# Patient Record
Sex: Female | Born: 2000 | Race: Black or African American | Hispanic: No | Marital: Single | Smoking: Never smoker
Health system: Southern US, Community
[De-identification: ages and names within clinical notes are randomized; demographics above are authoritative.]

## PROBLEM LIST (undated history)

## (undated) DIAGNOSIS — E301 Precocious puberty: Secondary | ICD-10-CM

## (undated) HISTORY — PX: TONSILLECTOMY: SUR1361

## (undated) HISTORY — DX: Precocious puberty: E30.1

---
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
## 2020-01-23 ENCOUNTER — Other Ambulatory Visit: Payer: Self-pay | Admitting: Women's Health

## 2020-01-28 ENCOUNTER — Telehealth: Payer: Self-pay | Admitting: *Deleted

## 2020-01-28 NOTE — Telephone Encounter (Signed)
Patient mother called requesting refill on Yaz birth control pills, over due for annual exam, will route to appointment desk to call and schedule with patient.

## 2020-01-28 NOTE — Telephone Encounter (Signed)
Left message for patient to call and schedule appointment for wellness exam.

## 2020-04-13 ENCOUNTER — Other Ambulatory Visit: Payer: Self-pay

## 2020-04-13 ENCOUNTER — Ambulatory Visit: Payer: Federal, State, Local not specified - PPO | Attending: Internal Medicine

## 2020-04-13 DIAGNOSIS — Z23 Encounter for immunization: Secondary | ICD-10-CM

## 2020-04-13 NOTE — Progress Notes (Signed)
   Covid-19 Vaccination Clinic  Name:  MANVIR THORSON    MRN: [id number] DOB: 09-22-2001  04/13/2020  Ms. Sinor was observed post Covid-19 immunization for 15 minutes without incident. She was provided with Vaccine Information Sheet and instruction to access the V-Safe system.   Ms. Jessie was instructed to call 911 with any severe reactions post vaccine: Marland Kitchen Difficulty breathing  . Swelling of face and throat  . A fast heartbeat  . A bad rash all over body  . Dizziness and weakness   Immunizations Administered    Name Date Dose VIS Date Route   Pfizer COVID-19 Vaccine 04/13/2020 11:14 AM 0.3 mL 02/19/2019 Intramuscular   Manufacturer: [employer], [employer]   Lot: [id number]   NDC: [address]-4

## 2020-05-05 ENCOUNTER — Ambulatory Visit: Payer: Federal, State, Local not specified - PPO | Attending: Internal Medicine

## 2020-05-05 DIAGNOSIS — Z23 Encounter for immunization: Secondary | ICD-10-CM

## 2020-05-05 NOTE — Progress Notes (Signed)
   Covid-19 Vaccination Clinic  Name:  Cassandra Davies    MRN: [id number] DOB: 03-23-2001  05/05/2020  [name] was observed post Covid-19 immunization for 15 minutes without incident. She was provided with Vaccine Information Sheet and instruction to access the V-Safe system.   [name] was instructed to call 911 with any severe reactions post vaccine: Marland Kitchen Difficulty breathing  . Swelling of face and throat  . A fast heartbeat  . A bad rash all over body  . Dizziness and weakness   Immunizations Administered    Name Date Dose VIS Date Route   Pfizer COVID-19 Vaccine 05/05/2020 10:58 AM 0.3 mL 02/19/2019 Intramuscular   Manufacturer: [employer], [employer]   Lot: [id number]   NDC: [address]-8

## 2020-07-30 DIAGNOSIS — M79674 Pain in right toe(s): Secondary | ICD-10-CM | POA: Diagnosis not present

## 2020-07-30 DIAGNOSIS — M79671 Pain in right foot: Secondary | ICD-10-CM | POA: Diagnosis not present

## 2020-10-08 ENCOUNTER — Encounter: Payer: Self-pay | Admitting: Nurse Practitioner

## 2020-10-08 ENCOUNTER — Other Ambulatory Visit: Payer: Self-pay

## 2020-10-08 ENCOUNTER — Ambulatory Visit: Payer: Federal, State, Local not specified - PPO | Admitting: Nurse Practitioner

## 2020-10-08 VITALS — BP 124/80 | Ht 60.0 in | Wt 135.0 lb

## 2020-10-08 DIAGNOSIS — Z30016 Encounter for initial prescription of transdermal patch hormonal contraceptive device: Secondary | ICD-10-CM | POA: Diagnosis not present

## 2020-10-08 DIAGNOSIS — Z01419 Encounter for gynecological examination (general) (routine) without abnormal findings: Secondary | ICD-10-CM

## 2020-10-08 MED ORDER — NORELGESTROMIN-ETH ESTRADIOL 150-35 MCG/24HR TD PTWK: 1.0000 | MEDICATED_PATCH | TRANSDERMAL | 3 refills | Status: DC

## 2020-10-08 NOTE — Patient Instructions (Addendum)
Health Maintenance, Female Adopting a healthy lifestyle and getting preventive care are important in promoting health and wellness. Ask your health care provider about:  The right schedule for you to have regular tests and exams.  Things you can do on your own to prevent diseases and keep yourself healthy. What should I know about diet, weight, and exercise? Eat a healthy diet   Eat a diet that includes plenty of vegetables, fruits, low-fat dairy products, and lean protein.  Do not eat a lot of foods that are high in solid fats, added sugars, or sodium. Maintain a healthy weight Body mass index (BMI) is used to identify weight problems. It estimates body fat based on height and weight. Your health care provider can help determine your BMI and help you achieve or maintain a healthy weight. Get regular exercise Get regular exercise. This is one of the most important things you can do for your health. Most adults should:  Exercise for at least 150 minutes each week. The exercise should increase your heart rate and make you sweat (moderate-intensity exercise).  Do strengthening exercises at least twice a week. This is in addition to the moderate-intensity exercise.  Spend less time sitting. Even light physical activity can be beneficial. Watch cholesterol and blood lipids Have your blood tested for lipids and cholesterol at 20 years of age, then have this test every 5 years. Have your cholesterol levels checked more often if:  Your lipid or cholesterol levels are high.  You are older than 19 years of age.  You are at high risk for heart disease. What should I know about cancer screening? Depending on your health history and family history, you may need to have cancer screening at various ages. This may include screening for:  Breast cancer.  Cervical cancer.  Colorectal cancer.  Skin cancer.  Lung cancer. What should I know about heart disease, diabetes, and high blood  pressure? Blood pressure and heart disease  High blood pressure causes heart disease and increases the risk of stroke. This is more likely to develop in people who have high blood pressure readings, are of African descent, or are overweight.  Have your blood pressure checked: ? Every 3-5 years if you are 18-39 years of age. ? Every year if you are 40 years old or older. Diabetes Have regular diabetes screenings. This checks your fasting blood sugar level. Have the screening done:  Once every three years after age 40 if you are at a normal weight and have a low risk for diabetes.  More often and at a younger age if you are overweight or have a high risk for diabetes. What should I know about preventing infection? Hepatitis B If you have a higher risk for hepatitis B, you should be screened for this virus. Talk with your health care provider to find out if you are at risk for hepatitis B infection. Hepatitis C Testing is recommended for:  Everyone born from 1945 through 1965.  Anyone with known risk factors for hepatitis C. Sexually transmitted infections (STIs)  Get screened for STIs, including gonorrhea and chlamydia, if: ? You are sexually active and are younger than 19 years of age. ? You are older than 19 years of age and your health care provider tells you that you are at risk for this type of infection. ? Your sexual activity has changed since you were last screened, and you are at increased risk for chlamydia or gonorrhea. Ask your health care provider if   you are at risk.  Ask your health care provider about whether you are at high risk for HIV. Your health care provider may recommend a prescription medicine to help prevent HIV infection. If you choose to take medicine to prevent HIV, you should first get tested for HIV. You should then be tested every 3 months for as long as you are taking the medicine. Pregnancy  If you are about to stop having your period (premenopausal) and  you may become pregnant, seek counseling before you get pregnant.  Take 400 to 800 micrograms (mcg) of folic acid every day if you become pregnant.  Ask for birth control (contraception) if you want to prevent pregnancy. Osteoporosis and menopause Osteoporosis is a disease in which the bones lose minerals and strength with aging. This can result in bone fractures. If you are 62 years old or older, or if you are at risk for osteoporosis and fractures, ask your health care provider if you should:  Be screened for bone loss.  Take a calcium or vitamin D supplement to lower your risk of fractures.  Be given hormone replacement therapy (HRT) to treat symptoms of menopause. Follow these instructions at home: Lifestyle  Do not use any products that contain nicotine or tobacco, such as cigarettes, e-cigarettes, and chewing tobacco. If you need help quitting, ask your health care provider.  Do not use street drugs.  Do not share needles.  Ask your health care provider for help if you need support or information about quitting drugs. Alcohol use  Do not drink alcohol if: ? Your health care provider tells you not to drink. ? You are pregnant, may be pregnant, or are planning to become pregnant.  If you drink alcohol: ? Limit how much you use to 0-1 drink a day. ? Limit intake if you are breastfeeding.  Be aware of how much alcohol is in your drink. In the [place] one drink equals one 12 oz bottle of beer (355 mL), one 5 oz glass of wine (148 mL), or one 1 oz glass of hard liquor (44 mL). General instructions  Schedule regular health, dental, and eye exams.  Stay current with your vaccines.  Tell your health care provider if: ? You often feel depressed. ? You have ever been abused or do not feel safe at home. Summary  Adopting a healthy lifestyle and getting preventive care are important in promoting health and wellness.  Follow your health care provider's instructions about healthy  diet, exercising, and getting tested or screened for diseases.  Follow your health care provider's instructions on monitoring your cholesterol and blood pressure. This information is not intended to replace advice given to you by your health care provider. Make sure you discuss any questions you have with your health care provider. Document Revised: 12/05/2018 Document Reviewed: 12/05/2018 Elsevier Patient Education  2020 Elsevier Inc. Ethinyl Estradiol; Norelgestromin skin patches What is this medicine? ETHINYL ESTRADIOL;NORELGESTROMIN (ETH in il es tra DYE ole; nor el JES troe min) skin patch is used as a contraceptive (birth control method). This medicine combines two types of female hormones, an estrogen and a progestin. This patch is used to prevent ovulation and pregnancy. This medicine may be used for other purposes; ask your health care provider or pharmacist if you have questions. COMMON BRAND NAME(S): Ortho Christianne Borrow What should I tell my health care provider before I take this medicine? They need to know if you have or ever had any of these conditions:  abnormal  vaginal bleeding  blood vessel disease or blood clots  breast, cervical, endometrial, ovarian, liver, or uterine cancer  diabetes  gallbladder disease  having surgery  heart disease or recent heart attack  high blood pressure  high cholesterol or triglycerides  history of irregular heartbeat or heart valve problems  kidney disease  liver disease  migraine headaches  protein C deficiency  protein S deficiency  recently had a baby, miscarriage, or abortion  stroke  systemic lupus erythematosus (SLE)  tobacco smoker  an unusual or allergic reaction to estrogens, progestins, other medicines, foods, dyes, or preservatives  pregnant or trying to get pregnant  breast-feeding How should I use this medicine? This patch is applied to the skin. Follow the directions on the prescription label.  Apply to clean, dry, healthy skin on the buttock, abdomen, upper outer arm or upper torso, in a place where it will not be rubbed by tight clothing. Do not use lotions or other cosmetics on the site where the patch will go. Press the patch firmly in place for 10 seconds to ensure good contact with the skin. Change the patch every 7 days on the same day of the week for 3 weeks. You will then have a break from the patch for 1 week, after which you will apply a new patch. Do not use your medicine more often than directed. Contact your pediatrician regarding the use of this medicine in children. Special care may be needed. This medicine has been used in female children who have started having menstrual periods. A patient package insert for the product will be given with each prescription and refill. Read this sheet carefully each time. The sheet may change frequently. Overdosage: If you think you have taken too much of this medicine contact a poison control center or emergency room at once. NOTE: This medicine is only for you. Do not share this medicine with others. What if I miss a dose? You will need to replace your patch once a week as directed. If your patch is lost or falls off, contact your health care professional for advice. You may need to use another form of birth control if your patch has been off for more than 1 day. What may interact with this medicine? Do not take this medicine with the following medications:  dasabuvir; ombitasvir; paritaprevir; ritonavir  ombitasvir; paritaprevir; ritonavir This medicine may also interact with the following medications:  acetaminophen  antibiotics or medicines for infections, especially rifampin, rifabutin, rifapentine, and possibly penicillins or tetracyclines  aprepitant or fosaprepitant  armodafinil  ascorbic acid (vitamin C)  barbiturate medicines, such as phenobarbital or primidone  bosentan  certain antiviral medicines for hepatitis,  HIV or AIDS  certain medicines for cancer treatment  certain medicines for seizures like carbamazepine, clobazam, felbamate, lamotrigine, oxcarbazepine, phenytoin, rufinamide, topiramate  certain medicines for treating high cholesterol  cyclosporine  dantrolene  elagolix  flibanserin  grapefruit juice  lesinurad  medicines for diabetes  medicines to treat fungal infections, such as griseofulvin, miconazole, fluconazole, ketoconazole, itraconazole, posaconazole or voriconazole  mifepristone  mitotane  modafinil  morphine  mycophenolate  St. John's wort  tamoxifen  temazepam  theophylline or aminophylline  thyroid hormones  tizanidine  tranexamic acid  ulipristal  warfarin This list may not describe all possible interactions. Give your health care provider a list of all the medicines, herbs, non-prescription drugs, or dietary supplements you use. Also tell them if you smoke, drink alcohol, or use illegal drugs. Some items may interact with  your medicine. What should I watch for while using this medicine? Visit your doctor or health care professional for regular checks on your progress. You will need a regular breast and pelvic exam and Pap smear while on this medicine. Use an additional method of contraception during the first cycle that you use this patch. If you have any reason to think you are pregnant, stop using this medicine right away and contact your doctor or health care professional. If you are using this medicine for hormone related problems, it may take several cycles of use to see improvement in your condition. Smoking increases the risk of getting a blood clot or having a stroke while you are using hormonal birth control, especially if you are more than 19 years old. You are strongly advised not to smoke. This medicine can make your body retain fluid, making your fingers, hands, or ankles swell. Your blood pressure can go up. Contact your doctor  or health care professional if you feel you are retaining fluid. This medicine can make you more sensitive to the sun. Keep out of the sun. If you cannot avoid being in the sun, wear protective clothing and use sunscreen. Do not use sun lamps or tanning beds/booths. If you wear contact lenses and notice visual changes, or if the lenses begin to feel uncomfortable, consult your eye care specialist. In some women, tenderness, swelling, or minor bleeding of the gums may occur. Notify your dentist if this happens. Brushing and flossing your teeth regularly may help limit this. See your dentist regularly and inform your dentist of the medicines you are taking. If you are going to have elective surgery or a MRI, you may need to stop using this medicine before the surgery or MRI. Consult your health care professional for advice. This medicine does not protect you against HIV infection (AIDS) or any other sexually transmitted diseases. What side effects may I notice from receiving this medicine? Side effects that you should report to your doctor or health care professional as soon as possible:  allergic reactions such as skin rash or itching, hives, swelling of the lips, mouth, tongue, or throat  breast tissue changes or discharge  dark patches of skin on your forehead, cheeks, upper lip, and chin  depression  high blood pressure  migraines or severe, sudden headaches  missed menstrual periods  signs and symptoms of a blood clot such as breathing problems; changes in vision; chest pain; severe, sudden headache; pain, swelling, warmth in the leg; trouble speaking; sudden numbness or weakness of the face, arm or leg  skin reactions at the patch site such as blistering, bleeding, itching, rash, or swelling  stomach pain  yellowing of the eyes or skin Side effects that usually do not require medical attention (report these to your doctor or health care professional if they continue or are  bothersome):  breast tenderness  irregular vaginal bleeding or spotting, particularly during the first 3 months of use  headache  nausea  painful menstrual periods  skin redness or mild irritation at site where applied  weight gain (slight) This list may not describe all possible side effects. Call your doctor for medical advice about side effects. You may report side effects to FDA at 1-800-FDA-1088. Where should I keep my medicine? Keep out of the reach of children. Store at room temperature between 15 and 30 degrees C (59 and 86 degrees F). Keep the patch in its pouch until time of use. Throw away any unused medicine  after the expiration date. Dispose of used patches properly. Since a used patch may still contain active hormones, fold the patch in half so that it sticks to itself prior to disposal. Throw away in a place where children or pets cannot reach. NOTE: This sheet is a summary. It may not cover all possible information. If you have questions about this medicine, talk to your doctor, pharmacist, or health care provider.  2020 Elsevier/Gold Standard (2019-03-19 11:56:29)

## 2020-10-08 NOTE — Progress Notes (Signed)
   Cassandra Davies March 27, 2001 [id number]   History:  19 y.o. G0 presents for annual exam. Was on Yaz in the past for DUB. Not currently on contraception and would like to discuss options. Irregular cycles since menarche at age 81. She does not feel she will remember to take pills due to irregular schedule at school. Gardasil series completed. Not currently sexually active.   Gynecologic History Patient's last menstrual period was 09/17/2020. Period Pattern: (!) Irregular Menstrual Flow: Light Menstrual Control: Thin pad, Tampon, Maxi pad Dysmenorrhea: (!) Mild Dysmenorrhea Symptoms: Cramping Contraception: abstinence   Past medical history, past surgical history, family history and social history were all reviewed and documented in the EPIC chart.  ROS:  A ROS was performed and pertinent positives and negatives are included.  Exam:  Vitals:   10/08/20 1159  BP: 124/80  Weight: 135 lb (61.2 kg)  Height: 5' (1.524 m)   Body mass index is 26.37 kg/m.  General appearance:  Normal Thyroid:  Symmetrical, normal in size, without palpable masses or nodularity. Respiratory  Auscultation:  Clear without wheezing or rhonchi Cardiovascular  Auscultation:  Regular rate, without rubs, murmurs or gallops  Edema/varicosities:  Not grossly evident Abdominal  Soft,nontender, without masses, guarding or rebound.  Liver/spleen:  No organomegaly noted  Hernia:  None appreciated  Skin  Inspection:  Grossly normal   Breasts: Examined lying and sitting.   Right: Without masses, retractions, discharge or axillary adenopathy.   Left: Without masses, retractions, discharge or axillary adenopathy. Gentitourinary   Inguinal/mons:  Normal without inguinal adenopathy  External genitalia:  Normal  BUS/Urethra/Skene's glands:  Normal  Vagina:  Normal  Cervix:  Normal  Uterus:  Normal in size, shape and contour.  Midline and mobile  Adnexa/parametria:     Rt: Without masses or  tenderness.   Lt: Without masses or tenderness.  Anus and perineum: Normal   Assessment/Plan:  19 y.o. G0 for annual exam.   Well female exam with routine gynecological exam - Education provided on SBEs, importance of preventative screenings, current guidelines, high calcium diet, regular exercise, and multivitamin daily.   Encounter for initial prescription of transdermal patch hormonal contraceptive device -We discussed contraception options to include pill, patch, vaginal ring, injectable, implant, and IUD. She would like to try Ortho Evra transdermal patch. Written and verbal education provided on proper use. Condom use for STI prevention if she becomes sexually active.   Follow up in 1 year for annual.      [name] [facility], 12:08 PM 10/08/2020

## 2020-10-20 DIAGNOSIS — Z23 Encounter for immunization: Secondary | ICD-10-CM | POA: Diagnosis not present

## 2020-10-29 DIAGNOSIS — S8001XA Contusion of right knee, initial encounter: Secondary | ICD-10-CM | POA: Diagnosis not present

## 2020-10-29 DIAGNOSIS — M7651 Patellar tendinitis, right knee: Secondary | ICD-10-CM | POA: Diagnosis not present

## 2021-03-31 ENCOUNTER — Other Ambulatory Visit: Payer: Self-pay

## 2021-03-31 ENCOUNTER — Encounter: Payer: Self-pay | Admitting: Podiatry

## 2021-03-31 ENCOUNTER — Ambulatory Visit: Payer: Federal, State, Local not specified - PPO | Admitting: Podiatry

## 2021-03-31 DIAGNOSIS — L600 Ingrowing nail: Secondary | ICD-10-CM | POA: Diagnosis not present

## 2021-03-31 MED ORDER — NEOMYCIN-POLYMYXIN-HC 3.5-10000-1 OT SOLN: OTIC | 1 refills | Status: DC

## 2021-03-31 NOTE — Patient Instructions (Signed)

## 2021-03-31 NOTE — Progress Notes (Signed)
Subjective:   Patient ID: Cassandra Davies, female   DOB: 20 y.o.   MRN: [id number]   HPI Patient states that her toe is not doing well and continues to thicken and is painful and is becoming increasingly difficult for her to deal with and did not respond to removal and oral medication a number of years ago.  Patient does not smoke likes to be active   Review of Systems  All other systems reviewed and are negative.       Objective:  Physical Exam Vitals and nursing note reviewed.  Constitutional:      Appearance: She is well-developed.  Pulmonary:     Effort: Pulmonary effort is normal.  Musculoskeletal:        General: Normal range of motion.  Skin:    General: Skin is warm.  Neurological:     Mental Status: She is alert.     Neurovascular status intact muscle strength adequate range of motion adequate.  Patient is noted to have a severely thickened hallux nail left dystrophic painful when pressed dorsally and it is abnormally discolored and has had history of trauma on 2 different occasions.  Patient has good digital perfusion well oriented x3     Assessment:  Damage left hallux nail dystrophic and painful when pressed with probable trauma part of the precipitating factor of this     Plan:  H&P reviewed with her mother.  Its been removed already without success and I do think that the consideration here is for permanent removal.  I explained the procedure risk they want to go this route understand the nail will not regrow and I allow them to read a consent form going over risk.  I infiltrated the left hallux 60 mg like Marcaine mixture sterile prep done and using sterile instrumentation remove the hallux nail exposed matrix applied phenol 5 applications 30 seconds followed by alcohol lavage and sterile dressing and gave instructions for soaks beginning at this time.  Encouraged to call questions concerns which may arise and leave dressing on 24 hours but take it off earlier if  any throbbing were to occur

## 2021-04-12 ENCOUNTER — Ambulatory Visit: Payer: Federal, State, Local not specified - PPO | Admitting: Nurse Practitioner

## 2021-04-12 ENCOUNTER — Other Ambulatory Visit: Payer: Self-pay

## 2021-04-12 ENCOUNTER — Encounter: Payer: Self-pay | Admitting: Nurse Practitioner

## 2021-04-12 VITALS — BP 118/66

## 2021-04-12 DIAGNOSIS — N939 Abnormal uterine and vaginal bleeding, unspecified: Secondary | ICD-10-CM

## 2021-04-12 LAB — HCG, QUANTITATIVE, PREGNANCY: HCG, Total, QN: <3 m[IU]/mL

## 2021-04-12 NOTE — Progress Notes (Signed)
   Acute Office Visit  Subjective:    Patient ID: Cassandra Davies, female    DOB: [birth year], 20 y.o.   MRN: [id number]   HPI 20 y.o. presents today for vaginal bleeding with episode of large tissue. History of irregular menses, on Ortho Evra patch with good cycle regulation. Had cycle 2/8, took plan B 2/16, and then had cycle 3/14. Yesterday she experienced painful cramping and a large amount of tissue with bleeding. She took video to show today. [facility]lam[facility]    Review of Systems  Constitutional: Negative.   Genitourinary: Positive for menstrual problem.       Objective:    Physical Exam Constitutional:      Appearance: Normal appearance.  Genitourinary:    General: Normal vulva.     Vagina: Bleeding present. No vaginal discharge or erythema.     Cervix: Normal.     BP 118/66   LMP 02/10/2021  Wt Readings from Last 3 Encounters:  10/08/20 135 lb (61.2 kg) (62 %, Z= 0.31)*  08/08/18 130 lb (59 kg) (63 %, Z= 0.34)*  07/04/16 113 lb 14.4 oz (51.7 kg) (44 %, Z= -0.14)*   * Growth percentiles are based on CDC (Girls, 2-20 Years) data.        Assessment & Plan:   Problem List Items Addressed This Visit   None   Visit Diagnoses    Episode of heavy vaginal bleeding    -  Primary   Relevant Orders   hCG, quantitative, pregnancy     Plan: Video shows large amount of tissue (3 inches approximately) with bleeding. We will check Hcg today to rule out embryonic tissue but reassured it is likely endometrial tissue. Continue to be consistent with Ortho Evra patch and practice safe sex. She is agreeable to plan.      [name] DNP, 11:01 AM 04/12/2021

## 2021-06-02 ENCOUNTER — Encounter: Payer: Self-pay | Admitting: Nurse Practitioner

## 2021-06-02 ENCOUNTER — Other Ambulatory Visit: Payer: Self-pay

## 2021-06-02 ENCOUNTER — Ambulatory Visit: Payer: Federal, State, Local not specified - PPO | Admitting: Nurse Practitioner

## 2021-06-02 VITALS — BP 110/70

## 2021-06-02 DIAGNOSIS — N760 Acute vaginitis: Secondary | ICD-10-CM | POA: Diagnosis not present

## 2021-06-02 DIAGNOSIS — B9689 Other specified bacterial agents as the cause of diseases classified elsewhere: Secondary | ICD-10-CM

## 2021-06-02 DIAGNOSIS — N898 Other specified noninflammatory disorders of vagina: Secondary | ICD-10-CM | POA: Diagnosis not present

## 2021-06-02 DIAGNOSIS — B373 Candidiasis of vulva and vagina: Secondary | ICD-10-CM | POA: Diagnosis not present

## 2021-06-02 DIAGNOSIS — B3731 Acute candidiasis of vulva and vagina: Secondary | ICD-10-CM

## 2021-06-02 LAB — WET PREP FOR TRICH, YEAST, CLUE

## 2021-06-02 MED ORDER — FLUCONAZOLE 150 MG PO TABS: 150.0000 mg | ORAL_TABLET | ORAL | 0 refills | Status: DC

## 2021-06-02 MED ORDER — METRONIDAZOLE 0.75 % VA GEL: 1.0000 | Freq: Every day | VAGINAL | 0 refills | Status: AC

## 2021-06-02 NOTE — Progress Notes (Signed)
   Acute Office Visit  Subjective:    Patient ID: Cassandra Davies, female    DOB: 11-25-2001, 20 y.o.   MRN: [id number]   HPI 20 y.o. presents today for vaginal odor and itching that started a few days ago.    Review of Systems  Constitutional: Negative.   Genitourinary:       Vagina odor and itching       Objective:    Physical Exam Constitutional:      Appearance: Normal appearance.  Genitourinary:    General: Normal vulva.     Vagina: Vaginal discharge and erythema present.     Cervix: Normal.     BP 110/70   LMP 05/13/2021  Wt Readings from Last 3 Encounters:  10/08/20 135 lb (61.2 kg) (62 %, Z= 0.31)*  08/08/18 130 lb (59 kg) (63 %, Z= 0.34)*  07/04/16 113 lb 14.4 oz (51.7 kg) (44 %, Z= -0.14)*   * Growth percentiles are based on CDC (Girls, 2-20 Years) data.   Wet prep + yeast + clue cells     Assessment & Plan:   Problem List Items Addressed This Visit   None   Visit Diagnoses    Bacterial vaginosis    -  Primary   Relevant Medications   fluconazole (DIFLUCAN) 150 MG tablet   metroNIDAZOLE (METROGEL) 0.75 % vaginal gel   Vaginal odor       Relevant Orders   WET PREP FOR TRICH, YEAST, CLUE   Vaginal candidiasis       Relevant Medications   fluconazole (DIFLUCAN) 150 MG tablet     Plan: Wet prep positive for yeast and clue cells - Diflucan 150 mg today and repeat in 3 days for total of 2 doses, Metrogel 0.75% nightly x 5 nights. She asked about recommendations for probiotic and this was provided to her. She will return if symptoms worsen or do not improve.      [name] DNP, 2:52 PM 06/02/2021

## 2021-10-28 DIAGNOSIS — H6693 Otitis media, unspecified, bilateral: Secondary | ICD-10-CM | POA: Diagnosis not present

## 2021-10-28 DIAGNOSIS — J029 Acute pharyngitis, unspecified: Secondary | ICD-10-CM | POA: Diagnosis not present

## 2021-12-06 ENCOUNTER — Ambulatory Visit (INDEPENDENT_AMBULATORY_CARE_PROVIDER_SITE_OTHER): Payer: Federal, State, Local not specified - PPO | Admitting: Nurse Practitioner

## 2021-12-06 ENCOUNTER — Encounter: Payer: Self-pay | Admitting: Nurse Practitioner

## 2021-12-06 ENCOUNTER — Other Ambulatory Visit: Payer: Self-pay

## 2021-12-06 VITALS — BP 120/76 | Ht 60.0 in | Wt 144.0 lb

## 2021-12-06 DIAGNOSIS — N898 Other specified noninflammatory disorders of vagina: Secondary | ICD-10-CM

## 2021-12-06 DIAGNOSIS — Z113 Encounter for screening for infections with a predominantly sexual mode of transmission: Secondary | ICD-10-CM | POA: Diagnosis not present

## 2021-12-06 DIAGNOSIS — N926 Irregular menstruation, unspecified: Secondary | ICD-10-CM

## 2021-12-06 DIAGNOSIS — Z01419 Encounter for gynecological examination (general) (routine) without abnormal findings: Secondary | ICD-10-CM

## 2021-12-06 DIAGNOSIS — Z3045 Encounter for surveillance of transdermal patch hormonal contraceptive device: Secondary | ICD-10-CM | POA: Diagnosis not present

## 2021-12-06 MED ORDER — NORELGESTROMIN-ETH ESTRADIOL 150-35 MCG/24HR TD PTWK: 1.0000 | MEDICATED_PATCH | TRANSDERMAL | 3 refills | Status: DC

## 2021-12-06 NOTE — Progress Notes (Signed)
   Cassandra Davies 2001/07/21 [id number]   History:  20 y.o. G0 presents for annual exam. History of irregular menses since menarche at age 49. Monthly cycles on Xulane patches. Her last 2 cycles have been heavy and painful to the point of leaving class. Her bleeding 12/8 consisted of 2 days of black-colored blood and then light spotting with total bleeding time of 3 days. She had severe cramping for 1-2 days. She had itching prior to menses and then noticed an odor today. Gardasil series completed.   Gynecologic History Patient's last menstrual period was 12/02/2021. Period Cycle (Days): 28 Period Duration (Days): 4 Period Pattern: Regular Menstrual Flow: Light, Moderate Dysmenorrhea: (!) Moderate Dysmenorrhea Symptoms: Cramping Contraception/Family planning: Ortho-Evra patches weekly Sexually active: Yes  Health Maintenance Last Pap: Not indicated Last mammogram: Not indicated Last colonoscopy: Not indicated Last Dexa: Not indicated   Past medical history, past surgical history, family history and social history were all reviewed and documented in the EPIC chart. 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 Studying abroad in [place] in January for 3 weeks.   ROS:  A ROS was performed and pertinent positives and negatives are included.  Exam:  Vitals:   12/06/21 1028  BP: 120/76  Weight: 144 lb (65.3 kg)  Height: 5' (1.524 m)    Body mass index is 28.12 kg/m.  General appearance:  Normal Thyroid:  Symmetrical, normal in size, without palpable masses or nodularity. Respiratory  Auscultation:  Clear without wheezing or rhonchi Cardiovascular  Auscultation:  Regular rate, without rubs, murmurs or gallops  Edema/varicosities:  Not grossly evident Abdominal  Soft,nontender, without masses, guarding or rebound.  Liver/spleen:  No organomegaly noted  Hernia:  None appreciated  Skin  Inspection:  Grossly normal   Breasts: Not indicated per guidelines Genitourinary   Inguinal/mons:   Normal without inguinal adenopathy  External genitalia:  Normal appearing vulva with no masses, tenderness, or lesions  BUS/Urethra/Skene's glands:  Normal  Vagina:  Normal appearing with normal color and discharge, no lesions  Cervix:  Normal appearing without discharge or lesions  Uterus:  Normal in size, shape and contour.  Midline and mobile, nontender  Adnexa/parametria:     Rt: Normal in size, without masses or tenderness.   Lt: Normal in size, without masses or tenderness.  Anus and perineum: Normal  Patient informed chaperone available to be present for breast and pelvic exam. Patient has requested no chaperone to be present. Patient has been advised what will be completed during breast and pelvic exam.    Assessment/Plan:  20 y.o. G0 for annual exam.   Well female exam with routine gynecological exam - Education provided on SBEs, importance of preventative screenings, current guidelines, high calcium diet, regular exercise, safe sex, and multivitamin daily.   Encounter for surveillance of transdermal patch hormonal contraceptive device - Plan: norelgestromin-ethinyl estradiol Burr Medico) 150-35 MCG/24HR transdermal patch. We discussed ways to manage heavy, painful menses with Ibuprofen along with alternate hormonal contraceptive method versus using patches continuously. She would like to use continuously.   Irregular menses - since menarche at age 5. Regular with Xuxlane patches.   Vaginal odor - Plan: SureSwab Advanced Vaginitis Plus,TMA  Screen for STD (sexually transmitted disease) - Plan: SureSwab Advanced Vaginitis Plus,TMA  Return in 1 year for annual.       [name] [facility], 10:57 AM 12/06/2021

## 2021-12-10 LAB — SURESWAB® ADVANCED VAGINITIS PLUS,TMA
C. trachomatis RNA, TMA: NOT DETECTED
CANDIDA SPECIES: DETECTED — AB
Candida glabrata: NOT DETECTED
N. gonorrhoeae RNA, TMA: NOT DETECTED
SURESWAB(R) ADV BACTERIAL VAGINOSIS(BV),TMA: POSITIVE — AB
TRICHOMONAS VAGINALIS (TV),TMA: NOT DETECTED

## 2021-12-13 ENCOUNTER — Other Ambulatory Visit: Payer: Self-pay | Admitting: Nurse Practitioner

## 2021-12-13 DIAGNOSIS — B9689 Other specified bacterial agents as the cause of diseases classified elsewhere: Secondary | ICD-10-CM

## 2021-12-13 DIAGNOSIS — B3731 Acute candidiasis of vulva and vagina: Secondary | ICD-10-CM

## 2021-12-13 MED ORDER — METRONIDAZOLE 0.75 % VA GEL
1.0000 | Freq: Every day | VAGINAL | 0 refills | Status: AC
Start: 2021-12-13 — End: 2021-12-18

## 2021-12-13 MED ORDER — FLUCONAZOLE 150 MG PO TABS: 150.0000 mg | ORAL_TABLET | ORAL | 0 refills | Status: DC

## 2021-12-28 ENCOUNTER — Ambulatory Visit: Payer: Federal, State, Local not specified - PPO | Admitting: Nurse Practitioner

## 2021-12-28 ENCOUNTER — Other Ambulatory Visit: Payer: Self-pay

## 2021-12-28 VITALS — BP 116/74

## 2021-12-28 DIAGNOSIS — N898 Other specified noninflammatory disorders of vagina: Secondary | ICD-10-CM

## 2021-12-28 LAB — WET PREP FOR TRICH, YEAST, CLUE

## 2021-12-28 NOTE — Progress Notes (Signed)
° °  Acute Office Visit  Subjective:    Patient ID: Cassandra Davies, female    DOB: Mar 13, 2001, 21 y.o.   MRN: [id number]   HPI 21 y.o. presents today for vaginal irritation. Was treated for yeast and BV 12/13/2021. Says symptoms went away and just the irritation returned a couple days after treatment. This also occurred just after her menses. Denies itching or discharge. Negative STD screening 12/06/2021, declines need now. She leaves for [place] in a few days and wants to make sure she does not have an infection.    Review of Systems  Constitutional: Negative.   Genitourinary:  Negative for genital sores, vaginal bleeding and vaginal discharge.       Vaginal irritation and mild odor      Objective:    Physical Exam Constitutional:      Appearance: Normal appearance.  Genitourinary:    General: Normal vulva.     Vagina: Normal.     Cervix: Normal.    BP 116/74    LMP 12/15/2021  Wt Readings from Last 3 Encounters:  12/06/21 144 lb (65.3 kg)  10/08/20 135 lb (61.2 kg) (62 %, Z= 0.31)*  08/08/18 130 lb (59 kg) (63 %, Z= 0.34)*   * Growth percentiles are based on CDC (Girls, 2-20 Years) data.   Wet prep negative     Assessment & Plan:   Problem List Items Addressed This Visit   None Visit Diagnoses     Vaginal irritation    -  Primary   Relevant Orders   WET PREP FOR TRICH, YEAST, CLUE (Completed)      Plan: Reassurance provided on normal wet prep and exam. Recommend avoiding soaps/body washes/ detergents with fragrance. She will return if symptoms worsen or do not improve.     [name] DNP, 12:36 PM 12/28/2021

## 2021-12-29 DIAGNOSIS — U071 COVID-19: Secondary | ICD-10-CM | POA: Diagnosis not present

## 2022-03-28 ENCOUNTER — Ambulatory Visit: Payer: Federal, State, Local not specified - PPO | Admitting: Nurse Practitioner

## 2022-03-28 ENCOUNTER — Encounter: Payer: Self-pay | Admitting: Nurse Practitioner

## 2022-03-28 VITALS — BP 116/74

## 2022-03-28 DIAGNOSIS — N898 Other specified noninflammatory disorders of vagina: Secondary | ICD-10-CM

## 2022-03-28 DIAGNOSIS — Z113 Encounter for screening for infections with a predominantly sexual mode of transmission: Secondary | ICD-10-CM

## 2022-03-28 DIAGNOSIS — N760 Acute vaginitis: Secondary | ICD-10-CM

## 2022-03-28 DIAGNOSIS — B9689 Other specified bacterial agents as the cause of diseases classified elsewhere: Secondary | ICD-10-CM

## 2022-03-28 DIAGNOSIS — B3731 Acute candidiasis of vulva and vagina: Secondary | ICD-10-CM | POA: Diagnosis not present

## 2022-03-28 LAB — WET PREP FOR TRICH, YEAST, CLUE

## 2022-03-28 MED ORDER — METRONIDAZOLE 0.75 % VA GEL: 1.0000 | VAGINAL | 0 refills | Status: AC

## 2022-03-28 MED ORDER — FLUCONAZOLE 150 MG PO TABS: 150.0000 mg | ORAL_TABLET | ORAL | 0 refills | Status: DC

## 2022-03-28 MED ORDER — METRONIDAZOLE 0.75 % VA GEL: 1.0000 | Freq: Every day | VAGINAL | 0 refills | Status: AC

## 2022-03-28 NOTE — Progress Notes (Signed)
? ?  Acute Office Visit ? ?Subjective:  ? ? Patient ID: Cassandra Davies, female    DOB: [birth year], 21 y.o.   MRN: [id number] ? ? ?HPI ?21 y.o. presents today for vaginal itching and discharge. New sexual partner recently. She feels she keeps getting BV. She had documented infections 05/2021 and 11/2021.  ? ? ?Review of Systems  ?Constitutional: Negative.   ?Genitourinary:  Positive for vaginal discharge.  ?     Vaginal itching  ? ?   ?Objective:  ?  ?Physical Exam ?Constitutional:   ?   Appearance: Normal appearance.  ?Genitourinary: ?   General: Normal vulva.  ?   Vagina: Vaginal discharge present.  ?   Cervix: Normal.  ? ? ?BP 116/74   LMP 01/26/2022  ?Wt Readings from Last 3 Encounters:  ?12/06/21 144 lb (65.3 kg)  ?10/08/20 135 lb (61.2 kg) (62 %, Z= 0.31)*  ?08/08/18 130 lb (59 kg) (63 %, Z= 0.34)*  ? ?* Growth percentiles are based on CDC (Girls, 2-20 Years) data.  ? ? ?   ? ?Patient informed chaperone available to be present for breast and pelvic exam. Patient has requested no chaperone to be present. Patient has been advised what will be completed during breast and pelvic exam.  ? ?Wet prep + clue cells (+ odor) + yeast (budding present) ? ?Assessment & Plan:  ? ?Problem List Items Addressed This Visit   ?None ?Visit Diagnoses   ? ? Bacterial vaginosis    -  Primary  ? Relevant Medications  ? metroNIDAZOLE (METROGEL) 0.75 % vaginal gel  ? fluconazole (DIFLUCAN) 150 MG tablet  ? metroNIDAZOLE (METROGEL) 0.75 % vaginal gel  ? Vagina itching      ? Relevant Orders  ? WET PREP FOR TRICH, YEAST, CLUE  ? Screen for STD (sexually transmitted disease)      ? Relevant Orders  ? C. trachomatis/N. gonorrhoeae RNA  ? RPR  ? HIV Antibody (routine testing w rflx)  ? Vaginal candidiasis      ? Relevant Medications  ? fluconazole (DIFLUCAN) 150 MG tablet  ? ?  ? ?Plan: Wet prep positive for yeast and clue cells. Diflucan 150 mg today and repeat in 3 days for total of 2 doses. Metrogel 0.75% nightly x 5 nights for current  infection. Due to recurrences, she will begin suppressive treatment with Metrogel twice weekly x 12 weeks. STD panel pending.  ? ? ? ? ?[name] DNP, 10:10 AM 03/28/2022 ? ?

## 2022-03-29 LAB — C. TRACHOMATIS/N. GONORRHOEAE RNA
C. trachomatis RNA, TMA: NOT DETECTED
N. gonorrhoeae RNA, TMA: NOT DETECTED

## 2022-03-29 LAB — RPR: RPR Ser Ql: NONREACTIVE

## 2022-03-29 LAB — HIV ANTIBODY (ROUTINE TESTING W REFLEX): HIV 1&2 Ab, 4th Generation: NONREACTIVE

## 2022-04-08 ENCOUNTER — Telehealth: Payer: Self-pay

## 2022-04-08 NOTE — Telephone Encounter (Signed)
Pt calling to report that she has completed the 5day course for the BV but states that the pharmacy did not give her the f/u treatment for reoccurring episodes. She was calling to confirm that it had been sent in or if she just needed to contact her pharmacy re: the separate Rx. ? ?Left detailed VM per DPR re: the Rx that was sent for longer term to her pharmacy on 03/28/22.  ? ?

## 2022-04-15 NOTE — Telephone Encounter (Signed)
Pt will contact us if additional assistance needed. Will close. ?

## 2022-08-09 ENCOUNTER — Ambulatory Visit
Admission: RE | Admit: 2022-08-09 | Discharge: 2022-08-09 | Disposition: A | Discharge status: Home / Self Care | Payer: Federal, State, Local not specified - PPO | Source: Ambulatory Visit | Attending: Physician Assistant | Admitting: Physician Assistant

## 2022-08-09 ENCOUNTER — Other Ambulatory Visit: Payer: Self-pay | Admitting: Physician Assistant

## 2022-08-09 DIAGNOSIS — R10813 Right lower quadrant abdominal tenderness: Secondary | ICD-10-CM

## 2022-08-09 DIAGNOSIS — R197 Diarrhea, unspecified: Secondary | ICD-10-CM

## 2022-08-09 MED ORDER — IOPAMIDOL (ISOVUE-300) INJECTION 61%
100.0000 mL | Freq: Once | INTRAVENOUS | Status: AC | PRN
  Administered 2022-08-09: 100 mL via INTRAVENOUS

## 2022-08-14 DIAGNOSIS — M25571 Pain in right ankle and joints of right foot: Secondary | ICD-10-CM | POA: Diagnosis not present

## 2022-08-14 DIAGNOSIS — S93491A Sprain of other ligament of right ankle, initial encounter: Secondary | ICD-10-CM | POA: Diagnosis not present

## 2022-08-21 DIAGNOSIS — S93401D Sprain of unspecified ligament of right ankle, subsequent encounter: Secondary | ICD-10-CM | POA: Diagnosis not present

## 2022-09-08 DIAGNOSIS — E559 Vitamin D deficiency, unspecified: Secondary | ICD-10-CM | POA: Diagnosis not present

## 2022-09-08 DIAGNOSIS — Z23 Encounter for immunization: Secondary | ICD-10-CM | POA: Diagnosis not present

## 2022-09-08 DIAGNOSIS — Z0000 Encounter for general adult medical examination without abnormal findings: Secondary | ICD-10-CM | POA: Diagnosis not present

## 2022-09-14 ENCOUNTER — Encounter: Payer: Self-pay | Admitting: Nurse Practitioner

## 2022-09-14 ENCOUNTER — Ambulatory Visit: Payer: Federal, State, Local not specified - PPO | Admitting: Nurse Practitioner

## 2022-09-14 VITALS — BP 102/70 | HR 75

## 2022-09-14 DIAGNOSIS — N911 Secondary amenorrhea: Secondary | ICD-10-CM

## 2022-09-14 MED ORDER — XULANE 150-35 MCG/24HR TD PTWK: 1.0000 | MEDICATED_PATCH | TRANSDERMAL | 1 refills | Status: DC

## 2022-09-14 NOTE — Progress Notes (Signed)
   Acute Office Visit  Subjective:    Patient ID: Cassandra Davies, female    DOB: 12/14/2001, 21 y.o.   MRN: [id number]   HPI 21 y.o. presents today for amenorrhea. No menses since 03/2022. H/O irregular menses since menarche. She has gone 8-9 months without a cycle before. Negative UPT 08/21/22, not sexually active since. CT pelvis 08/09/22, unremarkable. Had monthly cycles while using patch for about a year but felt her cramps were bad, so she stopped using. Normal TSH in 2020, no family history. Has not had PCOS workup. Denies hirsutism and acne.    Review of Systems  Constitutional: Negative.   Genitourinary:  Positive for menstrual problem.       Objective:    Physical Exam Constitutional:      Appearance: Normal appearance.  Genitourinary:    General: Normal vulva.     Vagina: Normal.     Cervix: Normal.     Uterus: Normal.      BP 102/70   Pulse 75   LMP 04/23/2022 (Exact Date)   SpO2 98%  Wt Readings from Last 3 Encounters:  12/06/21 144 lb (65.3 kg)  10/08/20 135 lb (61.2 kg) (62 %, Z= 0.31)*  08/08/18 130 lb (59 kg) (63 %, Z= 0.34)*   * Growth percentiles are based on CDC (Girls, 2-20 Years) data.        Patient informed chaperone available to be present for breast and/or pelvic exam. Patient has requested no chaperone to be present. Patient has been advised what will be completed during breast and pelvic exam.   Assessment & Plan:   Problem List Items Addressed This Visit   None Visit Diagnoses     Secondary amenorrhea    -  Primary   Relevant Medications   norelgestromin-ethinyl estradiol [name]) 150-35 MCG/24HR transdermal patch   Other Relevant Orders   Luteinizing hormone   Progesterone   Follicle stimulating hormone   Estradiol   Testos,Total,Free and SHBG (Female)   17-Hydroxyprogesterone      Plan: Hormone panel today. Recommend restarting hormonal contraception to help with cycle regulation and she agrees. She wants to restart patches.  Ibuprofen every 8 hours as needed for cramping.      [place], 1:56 PM 09/14/2022

## 2022-09-18 LAB — LUTEINIZING HORMONE: LH: 26.8 m[IU]/mL

## 2022-09-18 LAB — FOLLICLE STIMULATING HORMONE: FSH: 4.9 m[IU]/mL

## 2022-09-18 LAB — TESTOS,TOTAL,FREE AND SHBG (FEMALE)
Free Testosterone: 11.2 pg/mL — ABNORMAL HIGH (ref 0.1–6.4)
Sex Hormone Binding: 30 nmol/L (ref 17–124)
Testosterone, Total, LC-MS-MS: 65 ng/dL — ABNORMAL HIGH (ref 2–45)

## 2022-09-18 LAB — PROGESTERONE: Progesterone: 1.3 ng/mL

## 2022-09-18 LAB — ESTRADIOL: Estradiol: 68 pg/mL

## 2022-09-19 LAB — 17-HYDROXYPROGESTERONE: 17-OH-Progesterone, LC/MS/MS: 94 ng/dL

## 2022-12-01 ENCOUNTER — Ambulatory Visit (INDEPENDENT_AMBULATORY_CARE_PROVIDER_SITE_OTHER): Payer: Federal, State, Local not specified - PPO | Admitting: Oncology

## 2022-12-01 ENCOUNTER — Other Ambulatory Visit: Payer: Self-pay

## 2022-12-01 ENCOUNTER — Encounter: Payer: Self-pay | Admitting: Oncology

## 2022-12-01 VITALS — BP 120/64 | HR 69 | Temp 97.1°F | Resp 18 | Ht 60.0 in | Wt 149.0 lb

## 2022-12-01 DIAGNOSIS — Z30012 Encounter for prescription of emergency contraception: Secondary | ICD-10-CM

## 2022-12-01 MED ORDER — LEVONORGESTREL 1.5 MG PO TABS: 1.5000 mg | ORAL_TABLET | Freq: Once | ORAL | 0 refills | Status: AC

## 2022-12-01 NOTE — Progress Notes (Signed)
[facility] [address] [place], [place] [address] Phone: [phone] Fax: [phone]   Office Visit Note  Patient Name: Cassandra Davies  Date of Birth:2001/09/26  Med Rec number [id number]  Date of Service: 12/01/2022  Patient has no known allergies.  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 Had sex at 2am and 4am with a new female partner yesterday. Did not use protection. Unsure if he "pulled out" during intercourse and she is unable to get in touch with him. Has taken plan B in he past about a year ago. Tolerated well.   On Birth control and take it regularly. Started taking BC Xulane patch in October. Changes patch weekly.   Current Medication:  Outpatient Encounter Medications as of 12/01/2022  Medication Sig   norelgestromin-ethinyl estradiol Burr Medico) 150-35 MCG/24HR transdermal patch Place 1 patch onto the skin once a week.   No facility-administered encounter medications on file as of 12/01/2022.      Medical History: Past Medical History:  Diagnosis Date   Early puberty    menarche at age 76     Vital Signs: BP 120/64   Pulse 69   Temp (!) 97.1 F (36.2 C) (Tympanic)   Resp 18   Ht 5' (1.524 m)   Wt 149 lb (67.6 kg)   SpO2 99%   BMI 29.10 kg/m   ROS: As per HPI.  All other pertinent ROS negative.     Review of Systems  Constitutional: Negative.     Physical Exam Vitals and nursing note reviewed.  Constitutional:      Appearance: Normal appearance.  Neurological:     Mental Status: She is alert and oriented to person, place, and time.     No results found for this or any previous visit (from the past 24 hour(s)).  Assessment/Plan: 1. Encounter for emergency contraception -Discussed that the Xulane patch is effective in preventing pregnancy and as long as you are taking/changing the patch weekly and have  had a cycle you should not need  emergency contraception.  -Discussed taking verse not taking and student opted to take emergency contraception. Prescription prescribed in clinic.  -Discussed how and when to take medication. Provided written and verbal instructions on My Way.  -Please let me know if you have any questions or concerns.   - levonorgestrel (MY WAY) 1.5 MG tablet; Take 1 tablet (1.5 mg total) by mouth once for 1 dose.  Dispense: 1 tablet; Refill: 0   Disposition-RTC PRN.   General Counseling: Praise verbalizes understanding of the findings of todays visit and agrees with plan of treatment. I have discussed any further diagnostic evaluation that may be needed or ordered today. We also reviewed her medications today. she has been encouraged to call the office with any questions or concerns that should arise related to todays visit.   No orders of the defined types were placed in this encounter.  No orders of the defined types were placed in this encounter.  I spent 20 minutes dedicated to the care of this patient (face-to-face and non-face-to-face) on the date of the encounter to include what is described in the assessment and plan.  Durenda Hurt, NP 12/01/2022 11:03 AM

## 2022-12-28 ENCOUNTER — Encounter: Payer: Self-pay | Admitting: Family Medicine

## 2022-12-28 ENCOUNTER — Ambulatory Visit (INDEPENDENT_AMBULATORY_CARE_PROVIDER_SITE_OTHER): Payer: Federal, State, Local not specified - PPO | Admitting: Family Medicine

## 2022-12-28 ENCOUNTER — Other Ambulatory Visit: Payer: Self-pay

## 2022-12-28 VITALS — BP 112/70 | HR 77 | Temp 98.1°F | Resp 18 | Ht 59.75 in | Wt 151.0 lb

## 2022-12-28 DIAGNOSIS — N760 Acute vaginitis: Secondary | ICD-10-CM

## 2022-12-28 DIAGNOSIS — Z2839 Other underimmunization status: Secondary | ICD-10-CM

## 2022-12-28 LAB — POCT WET PREP (WET MOUNT)
Clue Cells Wet Prep Whiff POC: POSITIVE
Trichomonas Wet Prep HPF POC: ABSENT

## 2022-12-28 MED ORDER — METRONIDAZOLE 500 MG PO TABS: 500.0000 mg | ORAL_TABLET | Freq: Two times a day (BID) | ORAL | 0 refills | Status: AC

## 2022-12-28 NOTE — Progress Notes (Signed)
Cassandra. Davies, [place] [address] Phone: [phone] Fax: [phone]   Office Visit Note  Patient Name: Cassandra Davies  Date of [id number]:[id number]  Med Rec number [id number]  Date of Service: 12/28/2022  Patient has no known allergies.  Chief Complaint  Patient presents with   Vaginitis          Change in vaginal discharge, itchy, erythema of labia Discharge is white - started two days ago. Yeasty odor  Started 2 days after sex with female partner - no condom  Has history of recurrent yeast and BV infections and has a gynecologist locally  Has Pap smear scheduled for the Spring of this year Needs Tdap booster Has had her flu shot for this season    Current Medication:  Outpatient Encounter Medications as of 12/28/2022  Medication Sig   norelgestromin-ethinyl estradiol [name]) 150-35 MCG/24HR transdermal patch Place 1 patch onto the skin once a week.   No facility-administered encounter medications on file as of 12/28/2022.      Medical History: Past Medical History:  Diagnosis Date   Early puberty    menarche at age 22     Vital Signs: BP 112/70   Pulse 77   Temp 98.1 F (36.7 C) (Tympanic)   Resp 18   Ht 4' 11.75" (1.518 m)   Wt 151 lb (68.5 kg)   SpO2 99%   BMI 29.74 kg/m    Review of Systems  Constitutional: Negative.   Genitourinary:  Positive for vaginal discharge.  Psychiatric/Behavioral: Negative.      Physical Exam Vitals reviewed.  Constitutional:      General: She is in acute distress.  Genitourinary:    Exam position: Lithotomy position.     Pubic Area: No rash.      Vagina: Vaginal discharge and erythema present.     Comments: White thin discharge with odor Neurological:     Mental Status: She is alert.     Assessment/Plan:  1. Acute vaginitis Reviewed reasons why this may happen including exposure to seminal fluid (which would be prvented by using barrier like condom) or exposure to scented  body products of partner - POCT Wet Prep (Wet [place]) - metroNIDAZOLE (FLAGYL) 500 MG tablet; Take 1 tablet (500 mg total) by mouth 2 (two) times daily for 7 days.  Dispense: 14 tablet; Refill: 0  Results for orders placed or performed in visit on 12/28/22 (from the past 24 hour(s))  POCT Wet Prep [facility])     Status: Abnormal   Collection Time: 12/28/22  3:46 PM  Result Value Ref Range   Source Wet Prep POC     WBC, Wet Prep HPF POC few    Bacteria Wet Prep HPF POC     BACTERIA WET PREP MORPHOLOGY POC     Clue Cells Wet Prep HPF POC Moderate (A) None   Clue Cells Wet Prep Whiff POC Positive Whiff    Yeast Wet Prep HPF POC None None   KOH Wet Prep POC     Trichomonas Wet Prep HPF POC Absent Absent     2. Underimmunization status Offered Tdap booster today which she declines but will have in Spring with her gyn when she presnts for her annual exam and Pap Smear      General Counseling: Cassandra Davies verbalizes understanding of the findings of todays visit and agrees with plan of treatment. I have discussed any further diagnostic evaluation that may be needed or ordered today. We  also reviewed her medications today. she has been encouraged to call the office with any questions or concerns that should arise related to todays visit.   No orders of the defined types were placed in this encounter.   No orders of the defined types were placed in this encounter.   Time spent:25 Minutes   Dr Evette Doffing Shemica Meath ABFM University Physician

## 2023-03-11 ENCOUNTER — Other Ambulatory Visit: Payer: Self-pay | Admitting: Nurse Practitioner

## 2023-03-11 DIAGNOSIS — N911 Secondary amenorrhea: Secondary | ICD-10-CM

## 2023-03-13 NOTE — Telephone Encounter (Signed)
Med refill request: Xulane patch Last AEX: 12/1/222, last seen 09/14/22 and restarted patches then Next AEX: 03/14/23 Last MMG (if hormonal med) n/a Refill authorized: Please Advise?

## 2023-03-14 ENCOUNTER — Ambulatory Visit: Payer: Federal, State, Local not specified - PPO | Admitting: Nurse Practitioner

## 2023-03-14 ENCOUNTER — Encounter: Payer: Self-pay | Admitting: Nurse Practitioner

## 2023-03-14 VITALS — BP 102/72 | HR 100

## 2023-03-14 DIAGNOSIS — N760 Acute vaginitis: Secondary | ICD-10-CM

## 2023-03-14 DIAGNOSIS — N898 Other specified noninflammatory disorders of vagina: Secondary | ICD-10-CM | POA: Diagnosis not present

## 2023-03-14 DIAGNOSIS — Z113 Encounter for screening for infections with a predominantly sexual mode of transmission: Secondary | ICD-10-CM

## 2023-03-14 DIAGNOSIS — N3001 Acute cystitis with hematuria: Secondary | ICD-10-CM | POA: Diagnosis not present

## 2023-03-14 DIAGNOSIS — B9689 Other specified bacterial agents as the cause of diseases classified elsewhere: Secondary | ICD-10-CM

## 2023-03-14 DIAGNOSIS — R350 Frequency of micturition: Secondary | ICD-10-CM

## 2023-03-14 DIAGNOSIS — R1031 Right lower quadrant pain: Secondary | ICD-10-CM

## 2023-03-14 LAB — WET PREP FOR TRICH, YEAST, CLUE

## 2023-03-14 MED ORDER — METRONIDAZOLE 0.75 % VA GEL: 1.0000 | Freq: Every day | VAGINAL | 0 refills | Status: AC

## 2023-03-14 MED ORDER — NITROFURANTOIN MONOHYD MACRO 100 MG PO CAPS: 100.0000 mg | ORAL_CAPSULE | Freq: Two times a day (BID) | ORAL | 0 refills | Status: DC

## 2023-03-14 NOTE — Progress Notes (Signed)
   Acute Office Visit  Subjective:    Patient ID: Cassandra Davies, female    DOB: March 29, 2001, 22 y.o.   MRN: [id number]   HPI 22 y.o. presents today for lower abdominal discomfort, hematuria, urgency and frequency since yesterday. Has noticed some discharge, but no odor or itching. Would like STD screening today. Treated for yeast in January. Reports persistent RLQ abdominal pain since the fall. Normal CT scan of abdomin and pelvic August 2023. Pain is worse with menses and certain movements.    Review of Systems  Constitutional: Negative.   Gastrointestinal:  Positive for abdominal pain. Negative for constipation, diarrhea, nausea and vomiting.  Genitourinary:  Positive for frequency, hematuria, urgency and vaginal discharge. Negative for flank pain and vaginal pain.       Objective:    Physical Exam Constitutional:      Appearance: Normal appearance.  Abdominal:     Tenderness: There is abdominal tenderness in the right lower quadrant. There is no right CVA tenderness, left CVA tenderness, guarding or rebound.  Genitourinary:    General: Normal vulva.     Vagina: Vaginal discharge and erythema present.     Cervix: Erythema present.     BP 102/72   Pulse 100   LMP 03/03/2023 (Exact Date)   SpO2 98%  Wt Readings from Last 3 Encounters:  12/28/22 151 lb (68.5 kg)  12/01/22 149 lb (67.6 kg)  12/06/21 144 lb (65.3 kg)        Patient informed chaperone available to be present for breast and/or pelvic exam. Patient has requested no chaperone to be present. Patient has been advised what will be completed during breast and pelvic exam.   Wet prep + clue cells (+ odor)  UA: Trace leukocytes, nitrite positive, 3+ protein, 3+ blood, 4+ ketones, amber/cloudy. Microscopic: wbc 40-60, rbc >60, many bacteria  Assessment & Plan:   Problem List Items Addressed This Visit   None Visit Diagnoses     Acute cystitis with hematuria    -  Primary   Relevant Medications    nitrofurantoin, macrocrystal-monohydrate, (MACROBID) 100 MG capsule   Frequency of urination       Relevant Orders   Urinalysis,Complete w/RFL Culture   Bacterial vaginosis       Relevant Medications   metroNIDAZOLE (METROGEL) 0.75 % vaginal gel      Vaginal discharge       Relevant Orders   WET PREP FOR [place], YEAST, CLUE   Screening examination for STD (sexually transmitted disease)       Relevant Orders   C. trachomatis/N. gonorrhoeae RNA   Right lower quadrant abdominal pain       Relevant Orders   US PELVIS TRANSVAGINAL NON-OB (TV ONLY)      Plan: Wet prep positive for clue cells - Metrogel 0.75% nightly x 5 nights. UTI - Nitrofurantoin 100 mg BID x 7 days. OTC AZO for dysuria. GC/CT pending. Will schedule pelvic ultrasound for RLQ pain. Needs annual exam.     Tamela Gammon DNP, 4:05 PM 03/14/2023

## 2023-03-15 ENCOUNTER — Other Ambulatory Visit: Payer: Self-pay

## 2023-03-15 ENCOUNTER — Encounter: Payer: Self-pay | Admitting: Nurse Practitioner

## 2023-03-15 DIAGNOSIS — N911 Secondary amenorrhea: Secondary | ICD-10-CM

## 2023-03-15 LAB — C. TRACHOMATIS/N. GONORRHOEAE RNA
C. trachomatis RNA, TMA: NOT DETECTED
N. gonorrhoeae RNA, TMA: NOT DETECTED

## 2023-03-15 NOTE — Telephone Encounter (Signed)
Last AEX 12/06/2021--scheduled for 04/20/2023.  Rx sent for #9 patches on 03/14/2023.  Will refuse this rx request.

## 2023-03-16 LAB — URINE CULTURE
MICRO NUMBER:: 14711343
SPECIMEN QUALITY:: ADEQUATE

## 2023-03-16 LAB — URINALYSIS, COMPLETE W/RFL CULTURE
Casts: NONE SEEN /LPF
Crystals: NONE SEEN /HPF
Glucose, UA: NEGATIVE
Hyaline Cast: NONE SEEN /LPF
Nitrites, Initial: POSITIVE — AB
RBC / HPF: >=60 /HPF — AB (ref 0–2)
Specific Gravity, Urine: 1.024 (ref 1.001–1.035)
Yeast: NONE SEEN /HPF
pH: 6 (ref 5.0–8.0)

## 2023-03-16 LAB — CULTURE INDICATED

## 2023-04-20 ENCOUNTER — Other Ambulatory Visit (HOSPITAL_COMMUNITY)
Admission: RE | Admit: 2023-04-20 | Discharge: 2023-04-20 | Disposition: A | Discharge status: Home / Self Care | Payer: Federal, State, Local not specified - PPO | Source: Ambulatory Visit | Attending: Nurse Practitioner | Admitting: Nurse Practitioner

## 2023-04-20 ENCOUNTER — Ambulatory Visit (INDEPENDENT_AMBULATORY_CARE_PROVIDER_SITE_OTHER): Payer: Federal, State, Local not specified - PPO | Admitting: Nurse Practitioner

## 2023-04-20 ENCOUNTER — Encounter: Payer: Self-pay | Admitting: Nurse Practitioner

## 2023-04-20 VITALS — BP 118/72 | HR 85 | Ht 59.0 in | Wt 150.0 lb

## 2023-04-20 DIAGNOSIS — N926 Irregular menstruation, unspecified: Secondary | ICD-10-CM | POA: Diagnosis not present

## 2023-04-20 DIAGNOSIS — N898 Other specified noninflammatory disorders of vagina: Secondary | ICD-10-CM | POA: Diagnosis not present

## 2023-04-20 DIAGNOSIS — Z124 Encounter for screening for malignant neoplasm of cervix: Secondary | ICD-10-CM | POA: Insufficient documentation

## 2023-04-20 DIAGNOSIS — Z3045 Encounter for surveillance of transdermal patch hormonal contraceptive device: Secondary | ICD-10-CM

## 2023-04-20 DIAGNOSIS — B3731 Acute candidiasis of vulva and vagina: Secondary | ICD-10-CM | POA: Diagnosis not present

## 2023-04-20 DIAGNOSIS — Z01419 Encounter for gynecological examination (general) (routine) without abnormal findings: Secondary | ICD-10-CM | POA: Diagnosis not present

## 2023-04-20 DIAGNOSIS — N760 Acute vaginitis: Secondary | ICD-10-CM | POA: Diagnosis not present

## 2023-04-20 LAB — WET PREP FOR TRICH, YEAST, CLUE

## 2023-04-20 MED ORDER — NORELGESTROMIN-ETH ESTRADIOL 150-35 MCG/24HR TD PTWK
1.0000 | MEDICATED_PATCH | TRANSDERMAL | 3 refills | Status: DC
Start: 2023-04-20 — End: 2024-02-23

## 2023-04-20 MED ORDER — METRONIDAZOLE 500 MG PO TABS
500.0000 mg | ORAL_TABLET | Freq: Two times a day (BID) | ORAL | 0 refills | Status: DC
Start: 2023-04-20 — End: 2023-05-11

## 2023-04-20 MED ORDER — FLUCONAZOLE 150 MG PO TABS
150.0000 mg | ORAL_TABLET | ORAL | 0 refills | Status: DC
Start: 2023-04-20 — End: 2023-05-11

## 2023-04-20 NOTE — Progress Notes (Signed)
   Cassandra Davies 02-22-2001 [id number]   History:  22 y.o. G0 presents for annual exam. History of irregular menses since menarche at age 8. Monthly cycles on Xulane patches. Did have 2 cycles last month. Gardasil series completed. Treated for BV and UTI 03/14/23. Feels BV symptoms may have returned. Negative STD screening at that time. Ultrasound scheduled for RLQ pain 05/11/2023.   Gynecologic History Patient's last menstrual period was 03/25/2023 (exact date). Period Cycle (Days):  (28) Period Duration (Days): 5 Menstrual Flow: Moderate Menstrual Control: Tampon, Maxi pad Dysmenorrhea: (!) Moderate (mod to severe) Dysmenorrhea Symptoms: Cramping, Headache Contraception/Family planning: Ortho-Evra patches weekly Sexually active: Yes  Health Maintenance Last Pap: Never Last mammogram: Not indicated Last colonoscopy: Not indicated Last Dexa: Not indicated  Past medical history, past surgical history, family history and social history were all reviewed and documented in the EPIC chart. 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   ROS:  A ROS was performed and pertinent positives and negatives are included.  Exam:  Vitals:   04/20/23 1527  BP: 118/72  Pulse: 85  SpO2: 98%  Weight: 150 lb (68 kg)  Height:  (1.499 m)     Body mass index is 30.3 kg/m.  General appearance:  Normal Thyroid:  Symmetrical, normal in size, without palpable masses or nodularity. Respiratory  Auscultation:  Clear without wheezing or rhonchi Cardiovascular  Auscultation:  Regular rate, without rubs, murmurs or gallops  Edema/varicosities:  Not grossly evident Abdominal  Soft,nontender, without masses, guarding or rebound.  Liver/spleen:  No organomegaly noted  Hernia:  None appreciated  Skin  Inspection:  Grossly normal Breasts: Not indicated per guidelines Genitourinary   Inguinal/mons:  Normal without inguinal adenopathy  External genitalia:  Normal appearing vulva with no  masses, tenderness, or lesions  BUS/Urethra/Skene's glands:  Normal  Vagina:  Discharge present, no erythema  Cervix:  Normal appearing without discharge or lesions  Uterus:  Normal in size, shape and contour.  Midline and mobile, nontender  Adnexa/parametria:     Rt: Normal in size, without masses or tenderness.   Lt: Normal in size, without masses or tenderness.  Anus and perineum: Normal  Patient informed chaperone available to be present for breast and pelvic exam. Patient has requested no chaperone to be present. Patient has been advised what will be completed during breast and pelvic exam.   Wet prep + clue cells (+ odor), + yeast  Assessment/Plan:  22 y.o. G0 for annual exam.   Well female exam with routine gynecological exam - Education provided on SBEs, importance of preventative screenings, current guidelines, high calcium diet, regular exercise, safe sex, and multivitamin daily.   Screening for cervical cancer - Plan: Cytology - PAP( [facility]). Initial pap today.   Encounter for surveillance of transdermal patch hormonal contraceptive device - Plan: norelgestromin-ethinyl estradiol (ZAFEMY) 150-35 MCG/24HR transdermal patch weekly. Doing well on this.   Irregular menses - Plan: norelgestromin-ethinyl estradiol (ZAFEMY) 150-35 MCG/24HR transdermal patch weekly. Doing well on this.   Vaginal odor - Plan: WET PREP FOR TRICH, YEAST, CLUE. + yeast and BV.  Bacterial vaginosis - Plan: metroNIDAZOLE (FLAGYL) 500 MG tablet BID x 7 days. Boric acid vaginal suppositories twice weekly after treating current infection.   Vaginal candidiasis - Plan: fluconazole (DIFLUCAN) 150 MG tablet today and repeat in 3 days for total of 2 doses.   Return in 1 year for annual.       [name] [facility], 3:50 PM 04/20/2023

## 2023-04-24 LAB — CYTOLOGY - PAP: Diagnosis: NEGATIVE

## 2023-05-11 ENCOUNTER — Ambulatory Visit (INDEPENDENT_AMBULATORY_CARE_PROVIDER_SITE_OTHER): Payer: Federal, State, Local not specified - PPO

## 2023-05-11 ENCOUNTER — Ambulatory Visit: Payer: Federal, State, Local not specified - PPO | Admitting: Nurse Practitioner

## 2023-05-11 VITALS — BP 96/58

## 2023-05-11 DIAGNOSIS — R1031 Right lower quadrant pain: Secondary | ICD-10-CM

## 2023-05-11 NOTE — Progress Notes (Signed)
   Acute Office Visit  Subjective:    Patient ID: Cassandra Davies, female    DOB: May 12, 2001, 22 y.o.   MRN: [id number]   HPI 22 y.o. G0 presents today for ultrasound. Seen 03/14/23 with complaints of persistent RLQ abdominal pain since the fall. Normal CT scan of abdomen and pelvis August 2023. Pain is worse with menses and certain movements. Using contraceptive patch. Reports pain has decreased some, much better with most recent menses.   Patient's last menstrual period was 03/30/2023 (exact date).    Review of Systems  Constitutional: Negative.   Gastrointestinal:  Positive for abdominal pain.  Genitourinary:  Positive for menstrual problem.       Objective:    Physical Exam Constitutional:      Appearance: Normal appearance.   GU: Not indicated  BP (!) 96/58 (BP Location: Left Arm, Patient Position: Sitting, Cuff Size: Normal)   LMP 03/30/2023 (Exact Date)  Wt Readings from Last 3 Encounters:  04/20/23 150 lb (68 kg)  12/28/22 151 lb (68.5 kg)  12/01/22 149 lb (67.6 kg)        Assessment & Plan:   Problem List Items Addressed This Visit   None Visit Diagnoses     Right lower quadrant abdominal pain    -  Primary      Vaginal ultrasound: Anteverted uterus, normal size and shape, no myometrial masses.  Thick, symmetrical endometrium - 3.47 mm.  No masses or thickening seen.  Both ovaries mobile, normal size with normal follicle pattern and normal perfusion.  No adnexal masses, no free fluid.  Plan: Reviewed normal ultrasound. Pain has improved. If pain continues or worsens she will follow up with PCP.    [name] DNP, 3:50 PM 05/11/2023

## 2023-05-16 ENCOUNTER — Other Ambulatory Visit: Payer: Self-pay | Admitting: Nurse Practitioner

## 2023-05-16 DIAGNOSIS — Z3045 Encounter for surveillance of transdermal patch hormonal contraceptive device: Secondary | ICD-10-CM

## 2023-05-16 DIAGNOSIS — N926 Irregular menstruation, unspecified: Secondary | ICD-10-CM

## 2023-05-16 NOTE — Telephone Encounter (Signed)
Med refill request: Yafemy patch Last visit: 05/11/23, requesting 90 day supply Refill authorized: Please Advise?

## 2023-05-16 NOTE — Telephone Encounter (Signed)
Year supply of refills provided in April. Please have her check with pharmacy. 3 months was provided as well.

## 2023-05-18 ENCOUNTER — Telehealth: Payer: Self-pay

## 2023-05-18 NOTE — Telephone Encounter (Signed)
Spoke to pt via phone call. Pt has not been able to pick up prescription but has confirmed it is at the pharmacy. Will clal back with any questions.

## 2023-12-02 ENCOUNTER — Ambulatory Visit (INDEPENDENT_AMBULATORY_CARE_PROVIDER_SITE_OTHER): Payer: Federal, State, Local not specified - PPO | Admitting: Oncology

## 2023-12-02 ENCOUNTER — Encounter: Payer: Self-pay | Admitting: Oncology

## 2023-12-02 VITALS — BP 108/68 | HR 84 | Temp 97.8°F | Ht 59.0 in | Wt 145.0 lb

## 2023-12-02 DIAGNOSIS — Z113 Encounter for screening for infections with a predominantly sexual mode of transmission: Secondary | ICD-10-CM

## 2023-12-02 DIAGNOSIS — N760 Acute vaginitis: Secondary | ICD-10-CM

## 2023-12-02 MED ORDER — FLUCONAZOLE 150 MG PO TABS
150.0000 mg | ORAL_TABLET | ORAL | 0 refills | Status: DC
Start: 2023-12-02 — End: 2024-10-21

## 2023-12-02 NOTE — Progress Notes (Signed)
[facility] [address] [place], [place] [address] Phone: [phone] Fax: [phone]  Office Visit Note  Patient Name: Cassandra Davies  Date of Birth:Apr 04, 2001  Med Rec number [id number]  Date of Service: 12/02/2023  Patient has no known allergies.  No chief complaint on file.  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  Woke up this morning and felt like her vagina walls were touching. Noticed a lot of yeast or white thick discharge when she examined herself this morning.  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 Sx started Friday.   Had BV and diflucan in September and was treated with antibiotics and Diflucan.  She had 1 Diflucan leftover which she took this morning.  Would like to be tested for STIs.  Last was tested about 6 months ago.  Had sex over thanksgiving and the condom broke.  She is on birth control.  No known drug allergies.  No recent antibiotics.  Current Medication:  Outpatient Encounter Medications as of 12/02/2023  Medication Sig   norelgestromin-ethinyl estradiol (ZAFEMY) 150-35 MCG/24HR transdermal patch Place 1 patch onto the skin once a week.   Probiotic Product (PROBIOTIC PO) Take by mouth. (Patient not taking: Reported on 05/11/2023)   No facility-administered encounter medications on file as of 12/02/2023.    Medical History: Past Medical History:  Diagnosis Date   Early puberty    menarche at age 54    Vital Signs: There were no vitals taken for this visit.  ROS: As per HPI.  All other pertinent ROS negative.     Review of Systems  Genitourinary:  Positive for vaginal discharge and vaginal pain. Negative for dysuria.    Physical Exam Constitutional:      Appearance: Normal appearance.  Genitourinary:    Vagina: Vaginal discharge (Copious amounts of thick white discharge present.) present.  Neurological:     Mental Status: She is alert.     No results found  for this or any previous visit (from the past 24 hour(s)).  Assessment/Plan: 1. Routine screening for STI (sexually transmitted infection) -Routine urine sent for STI testing.  Will send results via MyChart when available.  - Chlamydia/Gonococcus/Trichomonas, NAA  2. Acute vaginitis -Exam reveals copious amounts of thick white vaginal discharge consistent with yeast infection.  We discussed sending swab for vaginitis which checks for bacterial vaginosis and different types of yeast.  Will send results via MyChart when available.  In the meantime, we will send in 3 150 mg tablets of Diflucan for her to use.  Repeat in 2 to 3 days.  - NuSwab Vaginitis Plus (VG+) - fluconazole (DIFLUCAN) 150 MG tablet; Take 1 tablet (150 mg total) by mouth every 3 (three) days.  Dispense: 3 tablet; Refill: 0   General Counseling: Jalyric verbalizes understanding of the findings of todays visit and agrees with plan of treatment. I have discussed any further diagnostic evaluation that may be needed or ordered today. We also reviewed her medications today. she has been encouraged to call the office with any questions or concerns that should arise related to todays visit.   No orders of the defined types were placed in this encounter.   No orders of the defined types were placed in this encounter.  I spent 20 minutes dedicated to the care of this patient (face-to-face and non-face-to-face) on the date of the encounter to include what is described in the assessment and plan.  Durenda Hurt, NP 12/02/2023 10:37 AM

## 2023-12-04 ENCOUNTER — Encounter: Payer: Self-pay | Admitting: Oncology

## 2023-12-04 LAB — CHLAMYDIA/GONOCOCCUS/TRICHOMONAS, NAA
Chlamydia by NAA: NEGATIVE
Gonococcus by NAA: NEGATIVE
Trich vag by NAA: NEGATIVE

## 2023-12-06 LAB — NUSWAB VAGINITIS PLUS (VG+)
Atopobium vaginae: HIGH {score} — AB
BVAB 2: HIGH {score} — AB
Candida albicans, NAA: POSITIVE — AB
Candida glabrata, NAA: NEGATIVE
Chlamydia trachomatis, NAA: NEGATIVE
Neisseria gonorrhoeae, NAA: NEGATIVE
Trich vag by NAA: NEGATIVE

## 2024-01-16 DIAGNOSIS — J111 Influenza due to unidentified influenza virus with other respiratory manifestations: Secondary | ICD-10-CM | POA: Diagnosis not present

## 2024-02-23 ENCOUNTER — Other Ambulatory Visit: Payer: Self-pay

## 2024-02-23 DIAGNOSIS — N926 Irregular menstruation, unspecified: Secondary | ICD-10-CM

## 2024-02-23 DIAGNOSIS — Z3045 Encounter for surveillance of transdermal patch hormonal contraceptive device: Secondary | ICD-10-CM

## 2024-02-23 NOTE — Telephone Encounter (Signed)
 Medication refill request: zafemy 150-35 mcg patch Last AEX:  04-20-23 Next AEX: not scheduled. Patient aware she will be due soon Last MMG (if hormonal medication request): none Refill authorized: please approve if appropriate

## 2024-02-26 MED ORDER — NORELGESTROMIN-ETH ESTRADIOL 150-35 MCG/24HR TD PTWK: 1.0000 | MEDICATED_PATCH | TRANSDERMAL | 0 refills | Status: DC

## 2024-05-05 DIAGNOSIS — S9032XA Contusion of left foot, initial encounter: Secondary | ICD-10-CM | POA: Diagnosis not present

## 2024-05-05 DIAGNOSIS — M79672 Pain in left foot: Secondary | ICD-10-CM | POA: Diagnosis not present

## 2024-05-18 ENCOUNTER — Other Ambulatory Visit: Payer: Self-pay | Admitting: Nurse Practitioner

## 2024-05-18 DIAGNOSIS — Z3045 Encounter for surveillance of transdermal patch hormonal contraceptive device: Secondary | ICD-10-CM

## 2024-05-18 DIAGNOSIS — N926 Irregular menstruation, unspecified: Secondary | ICD-10-CM

## 2024-05-21 NOTE — Telephone Encounter (Signed)
 Med refill request: Zafemy patch Last AEX: 04/20/23 TW Next AEX: 08/12/24 TW Last MMG (if hormonal med) n/a Refill authorized: Last RX sent 9 patch with zero refills on 02/26/24 TW. Please approve or deny as appropriate.

## 2024-05-27 ENCOUNTER — Ambulatory Visit: Admitting: Nurse Practitioner

## 2024-05-28 DIAGNOSIS — J029 Acute pharyngitis, unspecified: Secondary | ICD-10-CM | POA: Diagnosis not present

## 2024-05-28 DIAGNOSIS — R051 Acute cough: Secondary | ICD-10-CM | POA: Diagnosis not present

## 2024-05-28 DIAGNOSIS — R0981 Nasal congestion: Secondary | ICD-10-CM | POA: Diagnosis not present

## 2024-08-12 ENCOUNTER — Ambulatory Visit: Admitting: Nurse Practitioner

## 2024-08-16 ENCOUNTER — Other Ambulatory Visit: Payer: Self-pay | Admitting: Nurse Practitioner

## 2024-08-16 DIAGNOSIS — N926 Irregular menstruation, unspecified: Secondary | ICD-10-CM

## 2024-08-16 DIAGNOSIS — Z3045 Encounter for surveillance of transdermal patch hormonal contraceptive device: Secondary | ICD-10-CM

## 2024-08-16 MED ORDER — NORELGESTROMIN-ETH ESTRADIOL 150-35 MCG/24HR TD PTWK
1.0000 | MEDICATED_PATCH | TRANSDERMAL | 0 refills | Status: DC
Start: 2024-08-16 — End: 2024-10-21

## 2024-08-16 NOTE — Telephone Encounter (Signed)
 Patient left message on triage line requesting refill. AEX scheduled for 10/21/24.

## 2024-08-16 NOTE — Telephone Encounter (Signed)
 Med refill request: Zamfey Last AEX: 04/20/23 Next AEX: none scheduled Message sent to front desk to schedule AEX. Last MMG (if hormonal med) n/a Last refill: 05/21/2024 Refill denied.  Needs appointment Sent to provider for review.

## 2024-09-23 DIAGNOSIS — S0511XA Contusion of eyeball and orbital tissues, right eye, initial encounter: Secondary | ICD-10-CM | POA: Diagnosis not present

## 2024-09-23 DIAGNOSIS — X58XXXA Exposure to other specified factors, initial encounter: Secondary | ICD-10-CM | POA: Diagnosis not present

## 2024-09-23 DIAGNOSIS — H43391 Other vitreous opacities, right eye: Secondary | ICD-10-CM | POA: Diagnosis not present

## 2024-10-21 ENCOUNTER — Encounter: Payer: Self-pay | Admitting: Nurse Practitioner

## 2024-10-21 ENCOUNTER — Ambulatory Visit: Admitting: Nurse Practitioner

## 2024-10-21 VITALS — BP 106/70 | HR 88 | Ht 59.25 in | Wt 147.0 lb

## 2024-10-21 DIAGNOSIS — Z3045 Encounter for surveillance of transdermal patch hormonal contraceptive device: Secondary | ICD-10-CM

## 2024-10-21 DIAGNOSIS — Z1331 Encounter for screening for depression: Secondary | ICD-10-CM | POA: Diagnosis not present

## 2024-10-21 DIAGNOSIS — N926 Irregular menstruation, unspecified: Secondary | ICD-10-CM | POA: Diagnosis not present

## 2024-10-21 DIAGNOSIS — Z01419 Encounter for gynecological examination (general) (routine) without abnormal findings: Secondary | ICD-10-CM

## 2024-10-21 MED ORDER — NORELGESTROMIN-ETH ESTRADIOL 150-35 MCG/24HR TD PTWK: 1.0000 | MEDICATED_PATCH | TRANSDERMAL | 3 refills | Status: AC

## 2024-10-21 NOTE — Progress Notes (Signed)
   Cassandra Davies January 25, 2001 [id number]   History:  23 y.o. G0 presents for annual exam. H/O irregular menses since menarche at age 89. Monthly cycles on Xulane  patches. Normal pap history. Gardasil series completed.  Gynecologic History Patient's last menstrual period was 10/11/2024 (exact date). Period Duration (Days): 5-6 Period Pattern: Regular Menstrual Flow: Moderate (1 heavy day) Menstrual Control: Tampon, Maxi pad Dysmenorrhea: (!) Moderate Dysmenorrhea Symptoms: Cramping, Headache Contraception/Family planning: Ortho-Evra patches weekly Sexually active: No  Health Maintenance Last Pap: 04/20/2023. Results were: Normal Last mammogram: Not indicated Last colonoscopy: Not indicated Last Dexa: Not indicated  Past medical history, past surgical history, family history and social history were all reviewed and documented in the EPIC chart. 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   ROS:  A ROS was performed and pertinent positives and negatives are included.  Exam:  Vitals:   10/21/24 1553  BP: 106/70  Pulse: 88  SpO2: 99%  Weight: 147 lb (66.7 kg)  Height: 4' 11.25 (1.505 m)      Body mass index is 29.44 kg/m.  General appearance:  Normal Thyroid :  Symmetrical, normal in size, without palpable masses or nodularity. Respiratory  Auscultation:  Clear without wheezing or rhonchi Cardiovascular  Auscultation:  Regular rate, without rubs, murmurs or gallops  Edema/varicosities:  Not grossly evident Abdominal  Soft,nontender, without masses, guarding or rebound.  Liver/spleen:  No organomegaly noted  Hernia:  None appreciated  Skin  Inspection:  Grossly normal Breasts: Not indicated per guidelines Pelvic: External genitalia:  no lesions              Urethra:  normal appearing urethra with no masses, tenderness or lesions              Bartholins and Skenes: normal                 Vagina: normal appearing vagina with normal color and  discharge, no lesions              Cervix: no lesions Bimanual Exam:  Uterus:  no masses or tenderness              Adnexa: no mass, fullness, tenderness              Rectovaginal: Deferred              Anus:  normal, no lesions  [name], CMA present as chaperone.   Assessment/Plan:  23 y.o. G0 for annual exam.   Well female exam with routine gynecological exam - Education provided on SBEs, importance of preventative screenings, current guidelines, high calcium diet, regular exercise, safe sex, and multivitamin daily.   Encounter for surveillance of transdermal patch hormonal contraceptive device - Plan: norelgestromin -ethinyl estradiol  (ZAFEMY) 150-35 MCG/24HR transdermal patch weekly. Doing well on this.   Irregular menses - Plan: norelgestromin -ethinyl estradiol  (ZAFEMY) 150-35 MCG/24HR transdermal patch  weekly.  Screening for cervical cancer - Normal pap history. Will repeat at 3-year interval per guidelines.   Return in about 1 year (around 10/21/2025) for Annual.       Cassandra Davies, 4:15 PM 10/21/2024

## 2024-12-11 DIAGNOSIS — R509 Fever, unspecified: Secondary | ICD-10-CM | POA: Diagnosis not present

## 2024-12-11 DIAGNOSIS — J101 Influenza due to other identified influenza virus with other respiratory manifestations: Secondary | ICD-10-CM | POA: Diagnosis not present

## 2025-10-22 ENCOUNTER — Ambulatory Visit: Admitting: Nurse Practitioner
